# Patient Record
Sex: Female | Born: 2000 | Race: White | Hispanic: No | Marital: Single | Smoking: Never smoker
Health system: Southern US, Community
[De-identification: ages and names within clinical notes are randomized; demographics above are authoritative.]

## PROBLEM LIST (undated history)

## (undated) DIAGNOSIS — F909 Attention-deficit hyperactivity disorder, unspecified type: Secondary | ICD-10-CM

## (undated) HISTORY — PX: EYE SURGERY: SHX253

## (undated) HISTORY — PX: EXTERNAL EAR SURGERY: SHX627

## (undated) HISTORY — PX: TONSILLECTOMY: SUR1361

---

## 2018-04-15 ENCOUNTER — Ambulatory Visit (INDEPENDENT_AMBULATORY_CARE_PROVIDER_SITE_OTHER): Payer: BLUE CROSS/BLUE SHIELD | Admitting: Family Medicine

## 2018-04-15 ENCOUNTER — Encounter: Payer: Self-pay | Admitting: Family Medicine

## 2018-04-15 VITALS — BP 106/72 | HR 75 | Temp 97.8°F | Ht 65.0 in | Wt 139.0 lb

## 2018-04-15 DIAGNOSIS — F902 Attention-deficit hyperactivity disorder, combined type: Secondary | ICD-10-CM | POA: Diagnosis not present

## 2018-04-15 DIAGNOSIS — H9325 Central auditory processing disorder: Secondary | ICD-10-CM | POA: Diagnosis not present

## 2018-04-15 MED ORDER — AMPHETAMINE-DEXTROAMPHETAMINE 10 MG PO TABS: 10.0000 mg | ORAL_TABLET | Freq: Every day | ORAL | 0 refills | Status: DC

## 2018-04-15 MED ORDER — AMPHETAMINE-DEXTROAMPHETAMINE 20 MG PO TABS: 20.0000 mg | ORAL_TABLET | Freq: Every day | ORAL | 0 refills | Status: DC

## 2018-04-15 NOTE — Progress Notes (Addendum)
   Subjective:    Patient ID: Briana Carey, female    DOB: 03/05/2000, 18 y.o.   MRN: [id number]  HPI She is here for consultation concerning an underlying history of ADHD.  She has been on medication since high school and presently is taking Adderall 20 mg as well as a 10 in the afternoon.  She was evaluated at [facility] psychology department.  Her that information is in the record.  It indicates that she has had difficulty since grade school but was never diagnosed until high school.  She was placed on Adderall and has done quite nicely on that.  She is on the above-mentioned meication regimen.  The 20 mg pill lasts roughly 6 hours and when it wears off she becomes more distracted.   The psychological evaluation also showed an auditory processing disorder.  Review of Systems      Objective:   Physical Exam Alert and in no distress otherwise not examined.  Blood pressure is 106/72       Assessment & Plan:  ADHD (attention deficit hyperactivity disorder), combined type - Plan: amphetamine-dextroamphetamine (ADDERALL) 20 MG tablet, amphetamine-dextroamphetamine (ADDERALL) 10 MG tablet, amphetamine-dextroamphetamine (ADDERALL) 20 MG tablet, amphetamine-dextroamphetamine (ADDERALL) 20 MG tablet, amphetamine-dextroamphetamine (ADDERALL) 10 MG tablet, amphetamine-dextroamphetamine (ADDERALL) 10 MG tablet  Central auditory processing disorder She will continue on her present medication regimen of 20 mg in the morning and 10 mg in the afternoon and recheck here in 6 months

## 2018-04-15 NOTE — Addendum Note (Signed)
Addended by: Ronnald Nian on: 04/15/2018 12:21 PM   Modules accepted: Orders

## 2018-04-27 ENCOUNTER — Telehealth: Payer: Self-pay | Admitting: Family Medicine

## 2018-04-27 DIAGNOSIS — F902 Attention-deficit hyperactivity disorder, combined type: Secondary | ICD-10-CM

## 2018-04-27 MED ORDER — AMPHETAMINE-DEXTROAMPHETAMINE 10 MG PO TABS: 10.0000 mg | ORAL_TABLET | Freq: Every day | ORAL | 0 refills | Status: DC

## 2018-04-27 MED ORDER — AMPHETAMINE-DEXTROAMPHETAMINE 20 MG PO TABS: 20.0000 mg | ORAL_TABLET | Freq: Every day | ORAL | 0 refills | Status: DC

## 2018-04-27 NOTE — Telephone Encounter (Signed)
Medication was changed to a different drugstore due to insurance

## 2018-04-27 NOTE — Telephone Encounter (Signed)
Pt called and needs [facility] of [facility] because her insurance wont pay for it. [facility] on [address]

## 2018-04-27 NOTE — Telephone Encounter (Signed)
Done

## 2018-04-28 NOTE — Telephone Encounter (Signed)
Called pt no answer lvm. KH

## 2018-09-27 ENCOUNTER — Encounter (HOSPITAL_COMMUNITY): Payer: Self-pay | Admitting: Emergency Medicine

## 2018-09-27 ENCOUNTER — Emergency Department (HOSPITAL_COMMUNITY)
Admission: EM | Admit: 2018-09-27 | Discharge: 2018-09-27 | Disposition: A | Discharge status: Home / Self Care | Payer: BLUE CROSS/BLUE SHIELD | Attending: Emergency Medicine | Admitting: Emergency Medicine

## 2018-09-27 ENCOUNTER — Other Ambulatory Visit: Payer: Self-pay

## 2018-09-27 ENCOUNTER — Emergency Department (HOSPITAL_COMMUNITY): Payer: BLUE CROSS/BLUE SHIELD

## 2018-09-27 DIAGNOSIS — Z79899 Other long term (current) drug therapy: Secondary | ICD-10-CM | POA: Insufficient documentation

## 2018-09-27 DIAGNOSIS — S61411A Laceration without foreign body of right hand, initial encounter: Secondary | ICD-10-CM

## 2018-09-27 DIAGNOSIS — W25XXXA Contact with sharp glass, initial encounter: Secondary | ICD-10-CM | POA: Insufficient documentation

## 2018-09-27 DIAGNOSIS — Y93G1 Activity, food preparation and clean up: Secondary | ICD-10-CM | POA: Insufficient documentation

## 2018-09-27 DIAGNOSIS — S61210A Laceration without foreign body of right index finger without damage to nail, initial encounter: Secondary | ICD-10-CM | POA: Diagnosis not present

## 2018-09-27 DIAGNOSIS — Y92000 Kitchen of unspecified non-institutional (private) residence as  the place of occurrence of the external cause: Secondary | ICD-10-CM | POA: Insufficient documentation

## 2018-09-27 DIAGNOSIS — Y999 Unspecified external cause status: Secondary | ICD-10-CM | POA: Diagnosis not present

## 2018-09-27 HISTORY — DX: Attention-deficit hyperactivity disorder, unspecified type: F90.9

## 2018-09-27 MED ORDER — CEPHALEXIN 500 MG PO CAPS: 500.0000 mg | ORAL_CAPSULE | Freq: Four times a day (QID) | ORAL | 0 refills | Status: DC

## 2018-09-27 MED ORDER — BUPIVACAINE HCL (PF) 0.5 % IJ SOLN
10.0000 mL | Freq: Once | INTRAMUSCULAR | Status: AC
  Administered 2018-09-27: 10 mL
  Filled 2018-09-27: qty 30

## 2018-09-27 NOTE — Discharge Instructions (Signed)
°  Wound Care - Laceration You may remove the bandage after 24 hours. Clean the wound and surrounding area gently with tap water and mild soap. Rinse well and blot dry. Do not scrub the wound, as this may cause the wound edges to come apart. You may shower, but avoid submerging the wound, such as with a bath or swimming. Clean the wound daily to prevent infection. Do not use cleaners such as hydrogen peroxide or alcohol.   Scar reduction: Application of a topical antibiotic ointment, such as Neosporin, after the wound has begun to close and heal well can decrease scab formation and reduce scarring. After the wound has healed and wound closures have been removed, application of ointments such as Aquaphor can also reduce scar formation.  The key to scar reduction is keeping the skin well hydrated and supple. Drinking plenty of water throughout the day (At least eight 8oz glasses of water a day) is essential to staying well hydrated.  Sun exposure: Keep the wound out of the sun. After the wound has healed, continue to protect it from the sun by wearing protective clothing or applying sunscreen.  Pain: You may use Tylenol, naproxen, or ibuprofen for pain. Antiinflammatory medications: Take 600 mg of ibuprofen every 6 hours or 440 mg (over the counter dose) to 500 mg (prescription dose) of naproxen every 12 hours for the next 3 days. After this time, these medications may be used as needed for pain. Take these medications with food to avoid upset stomach. Choose only one of these medications, do not take them together. Acetaminophen (generic for Tylenol): Should you continue to have additional pain while taking the ibuprofen or naproxen, you may add in acetaminophen as needed. Your daily total maximum amount of acetaminophen from all sources should be limited to 4000mg /day for persons without liver problems, or 2000mg /day for those with liver problems.  Suture/staple removal: Suture removal to be managed by  the hand specialist.  Return to the ED sooner should the wound edges come apart or signs of infection arise, such as spreading redness, puffiness/swelling, pus draining from the wound, severe increase in pain, fever over 100.19F, or any other major issues.  For prescription assistance, may try using prescription discount sites or apps, such as goodrx.com

## 2018-09-27 NOTE — ED Notes (Signed)
Non stick wound dressing applied and finger splinted per PA request.

## 2018-09-27 NOTE — ED Provider Notes (Signed)
[facility]-EMERGENCY DEPT Provider Note   CSN: [id number] Arrival date & time: 09/27/18  1136    History   Chief Complaint Chief Complaint  Patient presents with  . Laceration    HPI Briana Carey is a 18 y.o. female.     HPI   Briana Carey is a 18 y.o. female, patient with no pertinent past medical history, presenting to the ED with laceration to the right hand that occurred shortly prior to arrival.  States she was washing dishes and a broken dish cut her hand. Pain is throbbing, moderate to severe, nonradiating. Tetanus updated 2012.  Patient currently attends [employer]. Denies numbness, weakness, other injuries.    Past Medical History:  Diagnosis Date  . ADHD     Patient Active Problem List   Diagnosis Date Noted  . ADHD (attention deficit hyperactivity disorder), combined type 04/15/2018  . Central auditory processing disorder 04/15/2018    History reviewed. No pertinent surgical history.   OB History   No obstetric history on file.      Home Medications    Prior to Admission medications   Medication Sig Start Date End Date Taking? Authorizing Provider  amphetamine-dextroamphetamine (ADDERALL) 10 MG tablet Take 1 tablet (10 mg total) by mouth daily after lunch. One in evening. 06/27/18   Ronnald NianLalonde, John C, MD  amphetamine-dextroamphetamine (ADDERALL) 10 MG tablet Take 1 tablet (10 mg total) by mouth daily after lunch. 05/28/18   Ronnald NianLalonde, John C, MD  amphetamine-dextroamphetamine (ADDERALL) 10 MG tablet Take 1 tablet (10 mg total) by mouth daily after lunch. 04/27/18   Ronnald NianLalonde, John C, MD  amphetamine-dextroamphetamine (ADDERALL) 20 MG tablet Take 1 tablet (20 mg total) by mouth daily. 06/27/18   Ronnald NianLalonde, John C, MD  amphetamine-dextroamphetamine (ADDERALL) 20 MG tablet Take 1 tablet (20 mg total) by mouth daily. 05/28/18   Ronnald NianLalonde, John C, MD  amphetamine-dextroamphetamine (ADDERALL) 20 MG tablet Take 1 tablet (20 mg total) by mouth daily. 04/27/18    Ronnald NianLalonde, John C, MD  cephALEXin (KEFLEX) 500 MG capsule Take 1 capsule (500 mg total) by mouth 4 (four) times daily. 09/27/18   Joy, Shawn C, PA-C  etonogestrel (NEXPLANON) 68 MG IMPL implant 1 each by Subdermal route once.    [provider]    Family History No family history on file.  Social History Social History   Tobacco Use  . Smoking status: Never Smoker  . Smokeless tobacco: Never Used  Substance Use Topics  . Alcohol use: Not Currently  . Drug use: Not Currently     Allergies   Patient has no known allergies.   Review of Systems Review of Systems  Musculoskeletal: Positive for arthralgias.  Skin: Positive for wound.  Neurological: Negative for weakness and numbness.     Physical Exam Updated Vital Signs BP (!) 169/95   Pulse 92   Temp 98.7 F (37.1 C) (Oral)   Resp (!) 22   SpO2 100%   Physical Exam Vitals signs and nursing note reviewed.  Constitutional:      General: She is not in acute distress.    Appearance: She is well-developed. She is not diaphoretic.  HENT:     Head: Normocephalic and atraumatic.  Eyes:     Conjunctiva/sclera: Conjunctivae normal.  Neck:     Musculoskeletal: Neck supple.  Cardiovascular:     Rate and Rhythm: Normal rate and regular rhythm.     Pulses:          Radial pulses are  2+ on the right side.  Pulmonary:     Effort: Pulmonary effort is normal.  Musculoskeletal:     Comments: Approximately 5 cm crescent-shaped laceration to the dorsal right hand over the MCP joint of the index finger. Patient has difficulty obtaining full extension at the MCP joint and has slow flexion at the MCP joint. Range of motion in the DIP and PIP joints are unaffected.  After the wound was anesthetized and washed out, it was noted there was violation of what appears to be the fascia covering the MCP joint. During suture repair, I was also able to confirm laceration through the extensor tendon.   Skin:    General: Skin is warm  and dry.     Capillary Refill: Capillary refill takes less than 2 seconds.     Coloration: Skin is not pale.  Neurological:     Mental Status: She is alert.     Comments: Sensation to light touch grossly intact throughout the right index finger, though somewhat decreased on the ulnar side. Patient is able to flex the right index finger at the MCP against resistance.  She is unable to extend at the MCP against resistance.  Psychiatric:        Behavior: Behavior normal.                       ED Treatments / Results  Labs (all labs ordered are listed, but only abnormal results are displayed) Labs Reviewed - No data to display  EKG None  Radiology Dg Hand Complete Right  Result Date: 09/27/2018 CLINICAL DATA:  Laceration to the second finger. EXAM: RIGHT HAND - COMPLETE 3+ VIEW COMPARISON:  None. FINDINGS: There is no evidence of fracture or dislocation. There is no evidence of arthropathy or other focal bone abnormality. Soft tissues laceration at the level of the second metacarpophalangeal joint. IMPRESSION: No acute osseous abnormality identified. Electronically Signed   By: Ted Mcalpineobrinka  Dimitrova M.D.   On: 09/27/2018 13:42    Procedures .Marland Kitchen.Laceration Repair  Date/Time: 09/27/2018 1:45 PM Performed by: Anselm PancoastJoy, Shawn C, PA-C Authorized by: Anselm PancoastJoy, Shawn C, PA-C   Consent:    Consent obtained:  Verbal   Consent given by:  Patient   Risks discussed:  Retained foreign body, pain, poor cosmetic result, poor wound healing, tendon damage, need for additional repair and infection Anesthesia (see MAR for exact dosages):    Anesthesia method:  Local infiltration   Local anesthetic:  Bupivacaine 0.5% w/o epi Laceration details:    Location:  Hand   Hand location:  R hand, dorsum   Length (cm):  5 Repair type:    Repair type:  Intermediate Pre-procedure details:    Preparation:  Patient was prepped and draped in usual sterile fashion and imaging obtained to evaluate for  foreign bodies Exploration:    Wound exploration: wound explored through full range of motion and entire depth of wound probed and visualized   Treatment:    Area cleansed with:  Betadine and saline   Amount of cleaning:  Extensive   Irrigation solution:  Sterile saline   Irrigation volume:  1000 cc   Irrigation method:  Syringe Fascia repair:    Suture size:  5-0   Suture material:  Vicryl (Rapide)   Suture technique:  Horizontal mattress   Number of sutures:  3 Skin repair:    Repair method:  Sutures   Suture size:  4-0   Wound skin closure material used: Vicryl.  Suture technique:  Horizontal mattress   Number of sutures:  8 Approximation:    Approximation:  Close Post-procedure details:    Dressing:  Non-adherent dressing, splint for protection and sterile dressing   Patient tolerance of procedure:  Tolerated well, no immediate complications   (including critical care time)  Medications Ordered in ED Medications  bupivacaine (MARCAINE) 0.5 % injection 10 mL (10 mLs Infiltration Given by Other 09/27/18 1437)     Initial Impression / Assessment and Plan / ED Course  I have reviewed the triage vital signs and the nursing notes.  Pertinent labs & imaging results that were available during my care of the patient were reviewed by me and considered in my medical decision making (see chart for details).  Clinical Course as of Sep 26 1549  Sun Sep 27, 2018  1231 Spoke with Dr. Doreatha Martin, on call for hand surgery. States he will review the images and call back.   [SJ]  7 Dr. Doreatha Martin called back. May be fascial or tendon damage based on pictures and physical exam findings.  States this will not be taken to the OR tonight. Repair the wound, including fascia repair.  Have the patient call the office tomorrow for follow up.   [SJ]  1430 Once I confirmed the complete tendon laceration during the course of the suture repair, I recontacted Dr. Doreatha Martin and updated him on this. He  states the patient can still follow-up with his office tomorrow morning.  Her case will likely be forwarded on to Dr. Burney Gauze for repair.    [SJ]    Clinical Course User Index [SJ] Joy, Shawn C, PA-C       Patient presents with laceration to the right dorsal hand over the MCP of the index finger.  She had suspicion for extensor tendon laceration on physical exam, which was confirmed during laceration repair. Patient was splinted in extension for comfort.  She will follow-up with hand surgery for definitive management. The patient was given instructions for home care as well as return precautions. Patient voices understanding of these instructions, accepts the plan, and is comfortable with discharge.   Findings and plan of care discussed with Gara Kroner, MD.   Final Clinical Impressions(s) / ED Diagnoses   Final diagnoses:  Laceration of right hand, foreign body presence unspecified, initial encounter    ED Discharge Orders         Ordered    cephALEXin (KEFLEX) 500 MG capsule  4 times daily     09/27/18 [place], Shawn C, PA-C 09/27/18 1555    LongWonda Olds, MD 09/27/18 1921

## 2018-09-27 NOTE — ED Notes (Signed)
XR at bedside

## 2018-09-27 NOTE — ED Triage Notes (Signed)
Patient reports washing dishes and breaking glass dish resulting in laceration to right index finger. Approximately two inch laceration. Bleeding in triage. Movement and sensation to finger.

## 2018-09-28 ENCOUNTER — Telehealth (HOSPITAL_COMMUNITY): Payer: Self-pay | Admitting: Emergency Medicine

## 2018-09-28 NOTE — Telephone Encounter (Signed)
09/28/18 4:40 PM Called to follow up with patient and check on her symptoms.  States pain is well controlled. She has been taking the antibiotic, as prescribed. No additional symptom onset. She has an appointment with hand surgeon, Dr. Burney Gauze, tomorrow morning.

## 2018-10-14 ENCOUNTER — Ambulatory Visit (INDEPENDENT_AMBULATORY_CARE_PROVIDER_SITE_OTHER): Payer: BC Managed Care – PPO | Admitting: Family Medicine

## 2018-10-14 VITALS — Temp 97.4°F | Wt 140.0 lb

## 2018-10-14 DIAGNOSIS — F902 Attention-deficit hyperactivity disorder, combined type: Secondary | ICD-10-CM

## 2018-10-14 MED ORDER — AMPHETAMINE-DEXTROAMPHETAMINE 20 MG PO TABS: 20.0000 mg | ORAL_TABLET | Freq: Every day | ORAL | 0 refills | Status: DC

## 2018-10-14 MED ORDER — AMPHETAMINE-DEXTROAMPHETAMINE 10 MG PO TABS: 10.0000 mg | ORAL_TABLET | Freq: Every day | ORAL | 0 refills | Status: DC

## 2018-10-14 NOTE — Progress Notes (Signed)
   Subjective:    Patient ID: Briana Carey, female    DOB: Jun 16, 2000, 18 y.o.   MRN: [id number]  HPI Documentation for virtual telephone encounter. Documentation for virtual audio and video telecommunications through WebEx encounter: The patient was located at home. The provider was located in the office. The patient did consent to this visit and is aware of possible charges through their insurance for this visit. The other persons participating in this telemedicine service were none. Time spent on call was 5 minutes and in review of previous records >10 minutes total. This virtual service is not related to other E/M service within previous 7 days 2 days visit and for ADD consult.  She has been taking 20 mg Adderall in the morning and last 5 hours.  She usually eats before she takes it because she knows it will interfere with her eating.  She gets good results with this.  She will take the 10 mg at 130 or 2 in the afternoon depending upon her afternoon schedule.  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 She is also under quarantine for COVID which will end on September 3 so she will will pick up a new prescription until then.  Review of Systems     Objective:   Physical Exam Alert and in no distress otherwise not examined       Assessment & Plan:  ADHD (attention deficit hyperactivity disorder), combined type - Plan: amphetamine-dextroamphetamine (ADDERALL) 20 MG tablet, amphetamine-dextroamphetamine (ADDERALL) 10 MG tablet, amphetamine-dextroamphetamine (ADDERALL) 10 MG tablet, amphetamine-dextroamphetamine (ADDERALL) 10 MG tablet, amphetamine-dextroamphetamine (ADDERALL) 20 MG tablet, amphetamine-dextroamphetamine (ADDERALL) 20 MG tablet She is using the medication appropriately.  I will continue her on this.  We will touch bases in 6 months.

## 2018-12-22 ENCOUNTER — Telehealth: Payer: Self-pay | Admitting: Family Medicine

## 2018-12-22 DIAGNOSIS — F902 Attention-deficit hyperactivity disorder, combined type: Secondary | ICD-10-CM

## 2018-12-22 MED ORDER — AMPHETAMINE-DEXTROAMPHETAMINE 10 MG PO TABS: 10.0000 mg | ORAL_TABLET | Freq: Every day | ORAL | 0 refills | Status: DC

## 2018-12-22 NOTE — Telephone Encounter (Signed)
Tammy from [facility] called and needs clarification on an adderall prescription that was sent in for pt. The directions stated for pt to take 1 tablet daily after lunch and 1 in the evening but its only 30 tablets. Please call Tammy or the pharmacist on duty to fix this [phone]

## 2019-03-09 ENCOUNTER — Telehealth: Payer: Self-pay

## 2019-03-09 NOTE — Telephone Encounter (Signed)
COVID athlete information received via fax from Athletic Trainer Verlene Mayer [phone] to set cardiology appt. Patient scheduled with Dr. Flora Lipps on 1/22 @ 9:20am. Trainer made aware of appointment to communicate to patient.

## 2019-03-11 NOTE — Progress Notes (Signed)
Cardiology Office Note:   Date:  03/12/2019  NAME:  Briana Carey    MRN: [id number] DOB:  06/10/2000   PCP:  Denita Lung, MD  Cardiologist:  No primary care provider on file.   Referring MD: Denita Lung, MD   Chief Complaint  Patient presents with  . Abnormal ECG   History of Present Illness:   Briana Carey is a 19 y.o. female with a hx of ADHD who is being seen today for the evaluation of abnormal EKG at the request of Denita Lung, MD.  She is a Web designer at [employer].  She presents for coronavirus heart screening.  She reports she was diagnosed with coronavirus on 02/16/2019.  Symptoms included loss of smell, aches, headache, cough, congestion and shortness of breath.  She did have symptoms of chest congestion.  She had no chest pain reported.  Symptoms lasted about 10 days and resolved on February 26, 2019.  She reports since her diagnosis she has had some shortness of breath with activity.  She is not gone back to competitive play and has mainly been doing activity such as walking.  She has no significant medical history other than an ADHD and she takes Adderall.  She has no significant cardiac medical history.  Mother has a history of PVCs.  She is not non-smoker and does not consume alcohol.  Today, she denies chest pain, shortness of breath, palpitations, any other symptoms.  Past Medical History: Past Medical History:  Diagnosis Date  . ADHD     Past Surgical History: Past Surgical History:  Procedure Laterality Date  . EXTERNAL EAR SURGERY    . EYE SURGERY    . TONSILLECTOMY      Current Medications: Current Meds  Medication Sig  . amphetamine-dextroamphetamine (ADDERALL) 10 MG tablet Take 1 tablet (10 mg total) by mouth daily after lunch.  . amphetamine-dextroamphetamine (ADDERALL) 10 MG tablet Take 1 tablet (10 mg total) by mouth daily after lunch.  . amphetamine-dextroamphetamine (ADDERALL) 10 MG tablet Take 1 tablet (10 mg total) by  mouth daily after lunch.  . amphetamine-dextroamphetamine (ADDERALL) 20 MG tablet Take 1 tablet (20 mg total) by mouth daily.  Marland Kitchen amphetamine-dextroamphetamine (ADDERALL) 20 MG tablet Take 1 tablet (20 mg total) by mouth daily.  Marland Kitchen amphetamine-dextroamphetamine (ADDERALL) 20 MG tablet Take 1 tablet (20 mg total) by mouth daily.  . cephALEXin (KEFLEX) 500 MG capsule Take 1 capsule (500 mg total) by mouth 4 (four) times daily.  Marland Kitchen etonogestrel (NEXPLANON) 68 MG IMPL implant 1 each by Subdermal route once.  . minocycline (MINOCIN) 50 MG capsule Take 50 mg by mouth 2 (two) times daily.     Allergies:    Patient has no known allergies.   Social History: Social History   Socioeconomic History  . Marital status: Single    Spouse name: Not on file  . Number of children: Not on file  . Years of education: Not on file  . Highest education level: Not on file  Occupational History  . Not on file  Tobacco Use  . Smoking status: Never Smoker  . Smokeless tobacco: Never Used  Substance and Sexual Activity  . Alcohol use: Not Currently  . Drug use: Not Currently  . Sexual activity: Yes    Birth control/protection: Pill  Other Topics Concern  . Not on file  Social History Narrative  . Not on file   Social Determinants of Health   Financial Resource Strain:   .  Difficulty of Paying Living Expenses: Not on file  Food Insecurity:   . Worried About Programme researcher, broadcasting/film/video in the Last Year: Not on file  . Ran Out of Food in the Last Year: Not on file  Transportation Needs:   . Lack of Transportation (Medical): Not on file  . Lack of Transportation (Non-Medical): Not on file  Physical Activity:   . Days of Exercise per Week: Not on file  . Minutes of Exercise per Session: Not on file  Stress:   . Feeling of Stress : Not on file  Social Connections:   . Frequency of Communication with Friends and Family: Not on file  . Frequency of Social Gatherings with Friends and Family: Not on file  .  Attends Religious Services: Not on file  . Active Member of Clubs or Organizations: Not on file  . Attends Banker Meetings: Not on file  . Marital Status: Not on file     Family History: The patient's family history includes Arrhythmia in her mother.  ROS:   All other ROS reviewed and negative. Pertinent positives noted in the HPI.     EKGs/Labs/Other Studies Reviewed:   The following studies were personally reviewed by me today:  EKG:  EKG is ordered today.  The ekg ordered today demonstrates sinus bradycardia, heart rate 57, no acute ST-T changes, no evidence of prior infarction, prominent sinus arrhythmia, and was personally reviewed by me.   Recent Labs: No results found for requested labs within last 8760 hours.   Recent Lipid Panel No results found for: CHOL, TRIG, HDL, CHOLHDL, VLDL, LDLCALC, LDLDIRECT  Physical Exam:   VS:  BP 125/79   Pulse (!) 57   Ht 5\' 6"  (1.676 m)   Wt 141 lb 9.6 oz (64.2 kg)   SpO2 99%   BMI 22.85 kg/m    Wt Readings from Last 3 Encounters:  03/12/19 141 lb 9.6 oz (64.2 kg)  10/14/18 140 lb (63.5 kg) (69 %, Z= 0.51)*  04/15/18 139 lb (63 kg) (70 %, Z= 0.52)*   * Growth percentiles are based on CDC (Girls, 2-20 Years) data.    General: Well nourished, well developed, in no acute distress Heart: Atraumatic, normal size  Eyes: PEERLA, EOMI  Neck: Supple, no JVD Endocrine: No thryomegaly Cardiac: Normal S1, S2; RRR; no murmurs, rubs, or gallops Lungs: Clear to auscultation bilaterally, no wheezing, rhonchi or rales  Abd: Soft, nontender, no hepatomegaly  Ext: No edema, pulses 2+ Musculoskeletal: No deformities, BUE and BLE strength normal and equal Skin: Warm and dry, no rashes   Neuro: Alert and oriented to person, place, time, and situation, CNII-XII grossly intact, no focal deficits  Psych: Normal mood and affect   ASSESSMENT:   Briana Carey is a 19 y.o. female who presents for the following: 1. Screening for heart  disease   2. Nonspecific abnormal electrocardiogram (ECG) (EKG)     PLAN:   1. Screening for heart disease 2. Nonspecific abnormal electrocardiogram (ECG) (EKG) -She does meet criteria for Covid heart screening.  She did have chest congestion and chest symptoms.  No chest pain reported.  Her EKG is normal for an athlete, sinus bradycardia with sinus arrhythmia.  There is no evidence of conduction disease and I have a low suspicion for myocarditis.  We will complete her screening protocol with a high-sensitivity troponin as well as echocardiogram with 3D full volume and strain imaging.  She she was instructed for no vigorous activity 24  hours before high-sensitivity troponin.  She cannot return to competitive play until we clear her with the above protocol.  She was in agreement with this plan.  We will see her as needed.   Disposition: Return if symptoms worsen or fail to improve.  Medication Adjustments/Labs and Tests Ordered: Current medicines are reviewed at length with the patient today.  Concerns regarding medicines are outlined above.  Orders Placed This Encounter  Procedures  . EKG 12-Lead  . ECHOCARDIOGRAM COMPLETE   No orders of the defined types were placed in this encounter.   Patient Instructions  Medication Instructions:  The current medical regimen is effective;  continue present plan and medications.  *If you need a refill on your cardiac medications before your next appointment, please call your pharmacy*  Lab Work: Troponin High Sensitivity- this can be done at [address] - same day as echo - Monday, Wednesday, Thursday, Friday only If you have labs (blood work) drawn today and your tests are completely normal, you will receive your results only by: Marland Kitchen MyChart Message (if you have MyChart) OR . A paper copy in the mail If you have any lab test that is abnormal or we need to change your treatment, we will call you to review the  results.  Testing/Procedures: Echocardiogram - Your physician has requested that you have an echocardiogram. Echocardiography is a painless test that uses sound waves to create images of your heart. It provides your doctor with information about the size and shape of your heart and how well your heart's chambers and valves are working. This procedure takes approximately one hour. There are no restrictions for this procedure. This will be performed at our [facility] location - [address], [address].   Follow-Up: At [facility], you and your health needs are our priority.  As part of our continuing mission to provide you with exceptional heart care, we have created designated Provider Care Teams.  These Care Teams include your primary Cardiologist (physician) and Advanced Practice Providers (APPs -  Physician Assistants and Nurse Practitioners) who all work together to provide you with the care you need, when you need it.  Your next appointment:   PRN  The format for your next appointment:   Either In Person or Virtual  Provider:   Lennie Odor, MD       Signed, Lenna Gilford. Flora Lipps, MD [facility]  [address] [place], [place] [address] [phone]  03/12/2019 10:25 AM

## 2019-03-12 ENCOUNTER — Encounter: Payer: Self-pay | Admitting: Cardiovascular Disease

## 2019-03-12 ENCOUNTER — Encounter (INDEPENDENT_AMBULATORY_CARE_PROVIDER_SITE_OTHER): Payer: Self-pay

## 2019-03-12 ENCOUNTER — Other Ambulatory Visit: Payer: Self-pay

## 2019-03-12 ENCOUNTER — Ambulatory Visit (INDEPENDENT_AMBULATORY_CARE_PROVIDER_SITE_OTHER): Payer: BC Managed Care – PPO | Admitting: Cardiovascular Disease

## 2019-03-12 VITALS — BP 125/79 | HR 57 | Ht 66.0 in | Wt 141.6 lb

## 2019-03-12 DIAGNOSIS — R9431 Abnormal electrocardiogram [ECG] [EKG]: Secondary | ICD-10-CM

## 2019-03-12 DIAGNOSIS — Z136 Encounter for screening for cardiovascular disorders: Secondary | ICD-10-CM | POA: Diagnosis not present

## 2019-03-12 NOTE — Telephone Encounter (Signed)
Patient was seen, tests ordered.  Thanks!

## 2019-03-12 NOTE — Patient Instructions (Signed)
Medication Instructions:  The current medical regimen is effective;  continue present plan and medications.  *If you need a refill on your cardiac medications before your next appointment, please call your pharmacy*  Lab Work: Troponin High Sensitivity- this can be done at [address] - same day as echo - Monday, Wednesday, Thursday, Friday only If you have labs (blood work) drawn today and your tests are completely normal, you will receive your results only by: Marland Kitchen MyChart Message (if you have MyChart) OR . A paper copy in the mail If you have any lab test that is abnormal or we need to change your treatment, we will call you to review the results.  Testing/Procedures: Echocardiogram - Your physician has requested that you have an echocardiogram. Echocardiography is a painless test that uses sound waves to create images of your heart. It provides your doctor with information about the size and shape of your heart and how well your heart's chambers and valves are working. This procedure takes approximately one hour. There are no restrictions for this procedure. This will be performed at our [facility] location - [address].   Follow-Up: At [facility], you and your health needs are our priority.  As part of our continuing mission to provide you with exceptional heart care, we have created designated Provider Care Teams.  These Care Teams include your primary Cardiologist (physician) and Advanced Practice Providers (APPs -  Physician Assistants and Nurse Practitioners) who all work together to provide you with the care you need, when you need it.  Your next appointment:   PRN  The format for your next appointment:   Either In Person or Virtual  Provider:   Lennie Odor, MD

## 2019-03-15 ENCOUNTER — Other Ambulatory Visit: Payer: BC Managed Care – PPO | Admitting: *Deleted

## 2019-03-15 ENCOUNTER — Other Ambulatory Visit: Payer: Self-pay

## 2019-03-15 DIAGNOSIS — Z136 Encounter for screening for cardiovascular disorders: Secondary | ICD-10-CM

## 2019-03-15 LAB — TROPONIN I (HIGH SENSITIVITY): Troponin I (High Sensitivity): <2 ng/L (ref <18)

## 2019-03-16 ENCOUNTER — Ambulatory Visit (HOSPITAL_COMMUNITY): Payer: BC Managed Care – PPO | Attending: Family Medicine

## 2019-03-16 DIAGNOSIS — Z136 Encounter for screening for cardiovascular disorders: Secondary | ICD-10-CM | POA: Diagnosis not present

## 2019-03-19 NOTE — Progress Notes (Signed)
Got it! Faxing it now

## 2019-09-29 ENCOUNTER — Encounter: Payer: Self-pay | Admitting: Family Medicine

## 2019-09-29 ENCOUNTER — Telehealth (INDEPENDENT_AMBULATORY_CARE_PROVIDER_SITE_OTHER): Payer: BC Managed Care – PPO | Admitting: Family Medicine

## 2019-09-29 ENCOUNTER — Other Ambulatory Visit: Payer: Self-pay

## 2019-09-29 VITALS — Ht 67.0 in | Wt 143.0 lb

## 2019-09-29 DIAGNOSIS — F902 Attention-deficit hyperactivity disorder, combined type: Secondary | ICD-10-CM

## 2019-09-29 MED ORDER — AMPHETAMINE-DEXTROAMPHETAMINE 10 MG PO TABS: 10.0000 mg | ORAL_TABLET | Freq: Every day | ORAL | 0 refills | Status: DC

## 2019-09-29 MED ORDER — AMPHETAMINE-DEXTROAMPHETAMINE 20 MG PO TABS: 20.0000 mg | ORAL_TABLET | Freq: Every day | ORAL | 0 refills | Status: DC

## 2019-09-29 NOTE — Progress Notes (Signed)
   Subjective:    Patient ID: Briana Carey, female    DOB: 02-07-2001, 18 y.o.   MRN: [id number]  HPI I connected with  Briana Carey on 09/29/19 by a video enabled telemedicine application and verified that I am speaking with the correct person using two identifiers. I discussed the limitations of evaluation and management by telemedicine. The patient expressed understanding and agreed to proceed.  Consult was done over the phone as she did not have access to a computer She is in her apartment I am in my office.  She is in her apartment.  I am in my office. She has underlying ADD and was diagnosed while a junior in high school.  She was then retested when she came to college.  Her present dosing regimen is 20 mg in the morning and 10 mg dosing around 1 in the afternoon which gives her roughly 8 hours of benefit.  She usually eats before she takes her first pill because it can interfere with her eating.  She did very well with her she did very well with her academics this last semester.  Review of Systems     Objective:   Physical Exam Alert and in no distress otherwise not examined       Assessment & Plan:  ADHD (attention deficit hyperactivity disorder), combined type - Plan: amphetamine-dextroamphetamine (ADDERALL) 10 MG tablet, amphetamine-dextroamphetamine (ADDERALL) 10 MG tablet, amphetamine-dextroamphetamine (ADDERALL) 20 MG tablet, amphetamine-dextroamphetamine (ADDERALL) 10 MG tablet, amphetamine-dextroamphetamine (ADDERALL) 20 MG tablet, amphetamine-dextroamphetamine (ADDERALL) 20 MG tablet

## 2019-10-19 ENCOUNTER — Ambulatory Visit (INDEPENDENT_AMBULATORY_CARE_PROVIDER_SITE_OTHER): Payer: Self-pay | Admitting: Orthopedic Surgery

## 2019-10-19 DIAGNOSIS — G8929 Other chronic pain: Secondary | ICD-10-CM

## 2019-10-19 DIAGNOSIS — M545 Low back pain: Secondary | ICD-10-CM

## 2019-10-20 ENCOUNTER — Ambulatory Visit: Payer: BC Managed Care – PPO | Admitting: Orthopedic Surgery

## 2019-10-26 ENCOUNTER — Other Ambulatory Visit: Payer: Self-pay | Admitting: Orthopedic Surgery

## 2019-10-26 DIAGNOSIS — M545 Low back pain, unspecified: Secondary | ICD-10-CM

## 2019-10-27 ENCOUNTER — Other Ambulatory Visit: Payer: Self-pay

## 2019-10-30 ENCOUNTER — Encounter: Payer: Self-pay | Admitting: Orthopedic Surgery

## 2019-10-30 NOTE — Progress Notes (Signed)
   Post-Op Visit Note   Patient: Briana Carey           Date of Birth: 2000-09-12           MRN: [id number] Visit Date: 10/19/2019 PCP: Ronnald Nian, MD   Assessment & Plan:  Chief Complaint: No chief complaint on file.  Visit Diagnoses:  1. 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  Started about 2 years ago.  Was primarily focal pain localized in the back with no radicular symptoms.  Hurts for her to practice on the turf.  She is try Tylenol and naproxen with no help.  She had an MRI scan which showed a small curve but nothing definitively treatable.  That scan was done 2 years ago.  Back was hurting the summer but she was doing some physical labor during the summer.  Fatigue in the legs is the new symptom.  The pain does not wake her from sleep at night.  She states that the back pain affects her in a way that she is about 60% of the player she would be if she had no back pain.  On exam she has normal gait alignment.  No leg atrophy.  Good 5 out of 5 ankle dorsiflexion plantarflexion quad hamstring strength.  No masses lymphadenopathy or skin changes noted in that back region.  Some pain with forward lateral bending.  Symmetric reflexes.  Impression is central disc lumbar spine with worsening radicular symptoms.  Plain radiographs from 2 years ago unremarkable for any type of bony pathology.  Needs MRI scan to evaluate central disc along with Medrol Dosepak and Robaxin.  Follow-up after that study.  Follow-Up Instructions: Return if symptoms worsen or fail to improve, for after MRI.   Orders:  No orders of the defined types were placed in this encounter.  No orders of the defined types were placed in this encounter.   Imaging: No results found.  PMFS History: Patient Active Problem List   Diagnosis Date Noted  . ADHD (attention deficit hyperactivity disorder), combined type 04/15/2018  . Central auditory  processing disorder 04/15/2018   Past Medical History:  Diagnosis Date  . ADHD     Family History  Problem Relation Age of Onset  . Arrhythmia Mother     Past Surgical History:  Procedure Laterality Date  . EXTERNAL EAR SURGERY    . EYE SURGERY    . TONSILLECTOMY     Social History   Occupational History  . Not on file  Tobacco Use  . Smoking status: Never Smoker  . Smokeless tobacco: Never Used  Substance and Sexual Activity  . Alcohol use: Not Currently  . Drug use: Not Currently  . Sexual activity: Yes    Birth control/protection: Pill

## 2019-11-01 ENCOUNTER — Ambulatory Visit (INDEPENDENT_AMBULATORY_CARE_PROVIDER_SITE_OTHER): Payer: BC Managed Care – PPO | Admitting: Orthopedic Surgery

## 2019-11-01 DIAGNOSIS — M545 Low back pain, unspecified: Secondary | ICD-10-CM

## 2019-11-03 ENCOUNTER — Other Ambulatory Visit: Payer: Self-pay

## 2019-11-03 ENCOUNTER — Encounter: Payer: Self-pay | Admitting: Orthopedic Surgery

## 2019-11-03 NOTE — Progress Notes (Signed)
   Post-Op Visit Note   Patient: Briana Carey           Date of Birth: 06/26/2000           MRN: [id number] Visit Date: 11/01/2019 PCP: Ronnald Nian, MD   Assessment & Plan:  Chief Complaint: No chief complaint on file.  Visit Diagnoses:  1. Acute low back pain, unspecified back pain laterality, unspecified whether sciatica present     Plan: Medicine is a patient with low back pain.  Since she was last seen she is had an MRI scan which shows a very small L4-5 bulging disc.  Symptoms have continued primarily in the back without radiation.  On exam she has no nerve root tension signs and some mild pain with forward lateral bending.  The rest of her exam is unchanged.  Plan at this time is to set her up with Dr. Alvester Morin for lumbar spine ESI.  Follow-up with me as needed.  This does not look surgical and should respond well to injection.  Follow-Up Instructions: No follow-ups on file.   Orders:  No orders of the defined types were placed in this encounter.  No orders of the defined types were placed in this encounter.   Imaging: No results found.  PMFS History: Patient Active Problem List   Diagnosis Date Noted  . ADHD (attention deficit hyperactivity disorder), combined type 04/15/2018  . Central auditory processing disorder 04/15/2018   Past Medical History:  Diagnosis Date  . ADHD     Family History  Problem Relation Age of Onset  . Arrhythmia Mother     Past Surgical History:  Procedure Laterality Date  . EXTERNAL EAR SURGERY    . EYE SURGERY    . TONSILLECTOMY     Social History   Occupational History  . Not on file  Tobacco Use  . Smoking status: Never Smoker  . Smokeless tobacco: Never Used  Substance and Sexual Activity  . Alcohol use: Not Currently  . Drug use: Not Currently  . Sexual activity: Yes    Birth control/protection: Pill

## 2019-11-04 NOTE — Addendum Note (Signed)
Addended byPrescott Parma on: 11/04/2019 09:35 AM   Modules accepted: Orders

## 2019-11-08 ENCOUNTER — Telehealth: Payer: Self-pay

## 2019-11-08 NOTE — Telephone Encounter (Signed)
Patient called triage. She is scheduled for an injection with Dr.Newton on 11/24/2019. She is requesting an earlier appointment if at all possible.  Please call patient back at #[phone]. Thanks!

## 2019-11-09 NOTE — Telephone Encounter (Signed)
Tried to call patient to discuss a possible work-in spot for injection. No answer and vm not set up.

## 2019-11-11 ENCOUNTER — Other Ambulatory Visit: Payer: Self-pay

## 2019-11-11 ENCOUNTER — Encounter: Payer: Self-pay | Admitting: Physical Medicine and Rehabilitation

## 2019-11-11 ENCOUNTER — Ambulatory Visit (INDEPENDENT_AMBULATORY_CARE_PROVIDER_SITE_OTHER): Payer: BC Managed Care – PPO | Admitting: Physical Medicine and Rehabilitation

## 2019-11-11 ENCOUNTER — Ambulatory Visit: Payer: Self-pay

## 2019-11-11 VITALS — BP 116/75 | HR 52

## 2019-11-11 DIAGNOSIS — M5116 Intervertebral disc disorders with radiculopathy, lumbar region: Secondary | ICD-10-CM

## 2019-11-11 MED ORDER — METHYLPREDNISOLONE ACETATE 80 MG/ML IJ SUSP
80.0000 mg | Freq: Once | INTRAMUSCULAR | Status: AC
  Administered 2019-11-11: 80 mg

## 2019-11-11 NOTE — Progress Notes (Signed)
Pt states lower back pain. Pt state when she playing soccer and having to run back, forward and having to zig n zag. Pt state when she sitting at her locker and evening laying in bed she feels the pain. Pt state she use heating pas and sometime muscle relaxer to ease the pain.  Numeric Pain Rating Scale and Functional Assessment Average Pain 6   In the last MONTH (on 0-10 scale) has pain interfered with the following?  1. General activity like being  able to carry out your everyday physical activities such as walking, climbing stairs, carrying groceries, or moving a chair?  Rating(7)   +Driver, -BT, -Dye Allergies.

## 2019-11-11 NOTE — Progress Notes (Addendum)
Briana Carey - 19 y.o. female MRN [id number]  Date of birth: Jun 12, 2000  Office Visit Note: Visit Date: 11/11/2019 PCP: Ronnald Nian, MD Referred by: Ronnald Nian, MD  Subjective: Chief Complaint  Patient presents with  . Lower Back - Pain   HPI:  Briana Carey is a 19 y.o. female who comes in today at the request of Dr. Burnard Bunting for planned Bilateral L5-S1 Lumbar epidural steroid injection with fluoroscopic guidance.  The patient has failed conservative care including home exercise, medications, time and activity modification.  This injection will be diagnostic and hopefully therapeutic.  Please see requesting physician notes for further details and justification.  MRI reviewed with images and spine model.  MRI reviewed in the note below.   ROS Otherwise per HPI.  Assessment & Plan: Visit Diagnoses:  1. Radiculopathy due to lumbar intervertebral disc disorder     Plan: No additional findings.   Meds & Orders:  Meds ordered this encounter  Medications  . methylPREDNISolone acetate (DEPO-MEDROL) injection 80 mg    Orders Placed This Encounter  Procedures  . XR C-ARM NO REPORT  . Epidural Steroid injection    Follow-up: Return for visit to requesting physician as needed.   Procedures: No procedures performed  Lumbar Epidural Steroid Injection - Interlaminar Approach with Fluoroscopic Guidance  Patient: Briana Carey      Date of Birth: [birth year] MRN: [id number] PCP: Ronnald Nian, MD      Visit Date: 11/11/2019   Universal Protocol:     Consent Given By: the patient  Position: PRONE  Additional Comments: Vital signs were monitored before and after the procedure. Patient was prepped and draped in the usual sterile fashion. The correct patient, procedure, and site was verified.   Injection Procedure Details:  Procedure Site One Meds Administered:  Meds ordered this encounter  Medications  . methylPREDNISolone acetate (DEPO-MEDROL)  injection 80 mg     Laterality: Right  Location/Site:  L4-L5  Needle size: 20 G  Needle type: Tuohy  Needle Placement: Paramedian epidural  Findings:   -Comments: Excellent flow of contrast into the epidural space.  Procedure Details: Using a paramedian approach from the side mentioned above, the region overlying the inferior lamina was localized under fluoroscopic visualization and the soft tissues overlying this structure were infiltrated with 4 ml. of 1% Lidocaine without Epinephrine. The Tuohy needle was inserted into the epidural space using a paramedian approach.   The epidural space was localized using loss of resistance along with lateral and bi-planar fluoroscopic views.  After negative aspirate for air, blood, and CSF, a 2 ml. volume of Isovue-250 was injected into the epidural space and the flow of contrast was observed. Radiographs were obtained for documentation purposes.    The injectate was administered into the level noted above.   Additional Comments:  The patient tolerated the procedure well Dressing: 2 x 2 sterile gauze and Band-Aid    Post-procedure details: Patient was observed during the procedure. Post-procedure instructions were reviewed.  Patient left the clinic in stable condition.     Clinical History: MRI scan which shows a very small L4-5 bulging disc.     Objective:  VS:  HT:    WT:   BMI:     BP:116/75  HR:(!) 52bpm  TEMP: ( )  RESP:  Physical Exam Constitutional:      General: She is not in acute distress.    Appearance: Normal appearance. She is not ill-appearing.  HENT:  Head: Normocephalic and atraumatic.     Right Ear: External ear normal.     Left Ear: External ear normal.  Eyes:     Extraocular Movements: Extraocular movements intact.  Cardiovascular:     Rate and Rhythm: Normal rate.     Pulses: Normal pulses.  Musculoskeletal:     Right lower leg: No edema.     Left lower leg: No edema.     Comments: Patient  has good distal strength with no pain over the greater trochanters.  No clonus or focal weakness.  Skin:    Findings: No erythema, lesion or rash.  Neurological:     General: No focal deficit present.     Mental Status: She is alert and oriented to person, place, and time.     Sensory: No sensory deficit.     Motor: No weakness or abnormal muscle tone.     Coordination: Coordination normal.  Psychiatric:        Mood and Affect: Mood normal.        Behavior: Behavior normal.      Imaging: Epidural Steroid injection  Result Date: 11/11/2019 Tyrell Antonio, MD     11/11/2019 10:35 AM Lumbar Epidural Steroid Injection - Interlaminar Approach with Fluoroscopic Guidance Patient: Briana Carey     Date of Birth: 2000/12/24 MRN: [id number] PCP: Ronnald Nian, MD     Visit Date: 11/11/2019  Universal Protocol:   Consent Given By: the patient Position: PRONE Additional Comments: Vital signs were monitored before and after the procedure. Patient was prepped and draped in the usual sterile fashion. The correct patient, procedure, and site was verified. Injection Procedure Details: Procedure Site One Meds Administered: Meds ordered this encounter Medications . methylPREDNISolone acetate (DEPO-MEDROL) injection 80 mg  Laterality: Right Location/Site: L4-L5 Needle size: 20 G Needle type: Tuohy Needle Placement: Paramedian epidural Findings:  -Comments: Excellent flow of contrast into the epidural space. Procedure Details: Using a paramedian approach from the side mentioned above, the region overlying the inferior lamina was localized under fluoroscopic visualization and the soft tissues overlying this structure were infiltrated with 4 ml. of 1% Lidocaine without Epinephrine. The Tuohy needle was inserted into the epidural space using a paramedian approach. The epidural space was localized using loss of resistance along with lateral and bi-planar fluoroscopic views.  After negative aspirate for air, blood, and  CSF, a 2 ml. volume of Isovue-250 was injected into the epidural space and the flow of contrast was observed. Radiographs were obtained for documentation purposes.  The injectate was administered into the level noted above. Additional Comments: The patient tolerated the procedure well Dressing: 2 x 2 sterile gauze and Band-Aid  Post-procedure details: Patient was observed during the procedure. Post-procedure instructions were reviewed. Patient left the clinic in stable condition.

## 2019-11-11 NOTE — Procedures (Signed)
Lumbar Epidural Steroid Injection - Interlaminar Approach with Fluoroscopic Guidance  Patient: Briana Carey      Date of Birth: 2000-03-10 MRN: [id number] PCP: Ronnald Nian, MD      Visit Date: 11/11/2019   Universal Protocol:     Consent Given By: the patient  Position: PRONE  Additional Comments: Vital signs were monitored before and after the procedure. Patient was prepped and draped in the usual sterile fashion. The correct patient, procedure, and site was verified.   Injection Procedure Details:  Procedure Site One Meds Administered:  Meds ordered this encounter  Medications   methylPREDNISolone acetate (DEPO-MEDROL) injection 80 mg     Laterality: Right  Location/Site:  L4-L5  Needle size: 20 G  Needle type: Tuohy  Needle Placement: Paramedian epidural  Findings:   -Comments: Excellent flow of contrast into the epidural space.  Procedure Details: Using a paramedian approach from the side mentioned above, the region overlying the inferior lamina was localized under fluoroscopic visualization and the soft tissues overlying this structure were infiltrated with 4 ml. of 1% Lidocaine without Epinephrine. The Tuohy needle was inserted into the epidural space using a paramedian approach.   The epidural space was localized using loss of resistance along with lateral and bi-planar fluoroscopic views.  After negative aspirate for air, blood, and CSF, a 2 ml. volume of Isovue-250 was injected into the epidural space and the flow of contrast was observed. Radiographs were obtained for documentation purposes.    The injectate was administered into the level noted above.   Additional Comments:  The patient tolerated the procedure well Dressing: 2 x 2 sterile gauze and Band-Aid    Post-procedure details: Patient was observed during the procedure. Post-procedure instructions were reviewed.  Patient left the clinic in stable condition.

## 2019-11-24 ENCOUNTER — Ambulatory Visit: Payer: BC Managed Care – PPO | Admitting: Physical Medicine and Rehabilitation

## 2020-11-13 ENCOUNTER — Other Ambulatory Visit: Payer: Self-pay

## 2020-11-13 ENCOUNTER — Telehealth (INDEPENDENT_AMBULATORY_CARE_PROVIDER_SITE_OTHER): Payer: BC Managed Care – PPO | Admitting: Family Medicine

## 2020-11-13 DIAGNOSIS — F902 Attention-deficit hyperactivity disorder, combined type: Secondary | ICD-10-CM

## 2020-11-13 MED ORDER — AMPHETAMINE-DEXTROAMPHETAMINE 20 MG PO TABS: 20.0000 mg | ORAL_TABLET | Freq: Two times a day (BID) | ORAL | 0 refills | Status: AC

## 2020-11-13 MED ORDER — AMPHETAMINE-DEXTROAMPHETAMINE 20 MG PO TABS: 20.0000 mg | ORAL_TABLET | Freq: Every day | ORAL | 0 refills | Status: AC

## 2020-11-13 NOTE — Progress Notes (Signed)
   Subjective:    Patient ID: Briana Carey, female    DOB: 09/18/2000, 20 y.o.   MRN: [id number]  HPI Documentation for virtual audio and video telecommunications through Caregility encounter: The patient was located at home. 2 patient identifiers used.  The provider was located in the office. The patient did consent to this visit and is aware of possible charges through their insurance for this visit. The other persons participating in this telemedicine service were none. Time spent on call was 4 minutes and in review of previous records >16 minutes total for counseling and coordination of care. This virtual service is not related to other E/M service within previous 7 days.  Today's visit is to discuss her ADD medication.  She has been alternating between 10 and 20.  She states that the 20 makes her have a dry mouth however upon further discussion with her, the real issue is that she is not hydrating or giving herself nutrition while on the medication.  Last roughly 7 hours.  She states that it definitely does help her focus.  Review of Systems     Objective:   Physical Exam Alert and in no distress otherwise not examined       Assessment & Plan:  ADHD (attention deficit hyperactivity disorder), combined type - Plan: amphetamine-dextroamphetamine (ADDERALL) 20 MG tablet, amphetamine-dextroamphetamine (ADDERALL) 20 MG tablet, amphetamine-dextroamphetamine (ADDERALL) 20 MG tablet I will go ahead and give her the 20 mg.  She states she might on occasion break it in half and only used 10 recognizing we will give her full focus.  I then discussed the fact that she needs to keep her self well-hydrated and sometimes even eat things when she does not want to.  This is especially true if she is really busy during the summer months.  Also reinforced the need for her to use it on an as-needed basis which she has been doing anyway

## 2021-02-07 IMAGING — DX RIGHT HAND - COMPLETE 3+ VIEW
3 series · 3 of 3 positions shown · non-contrast
Comparison: None.

CLINICAL DATA: Laceration to the second finger.

EXAM:
RIGHT HAND - COMPLETE 3+ VIEW

[hand ap]
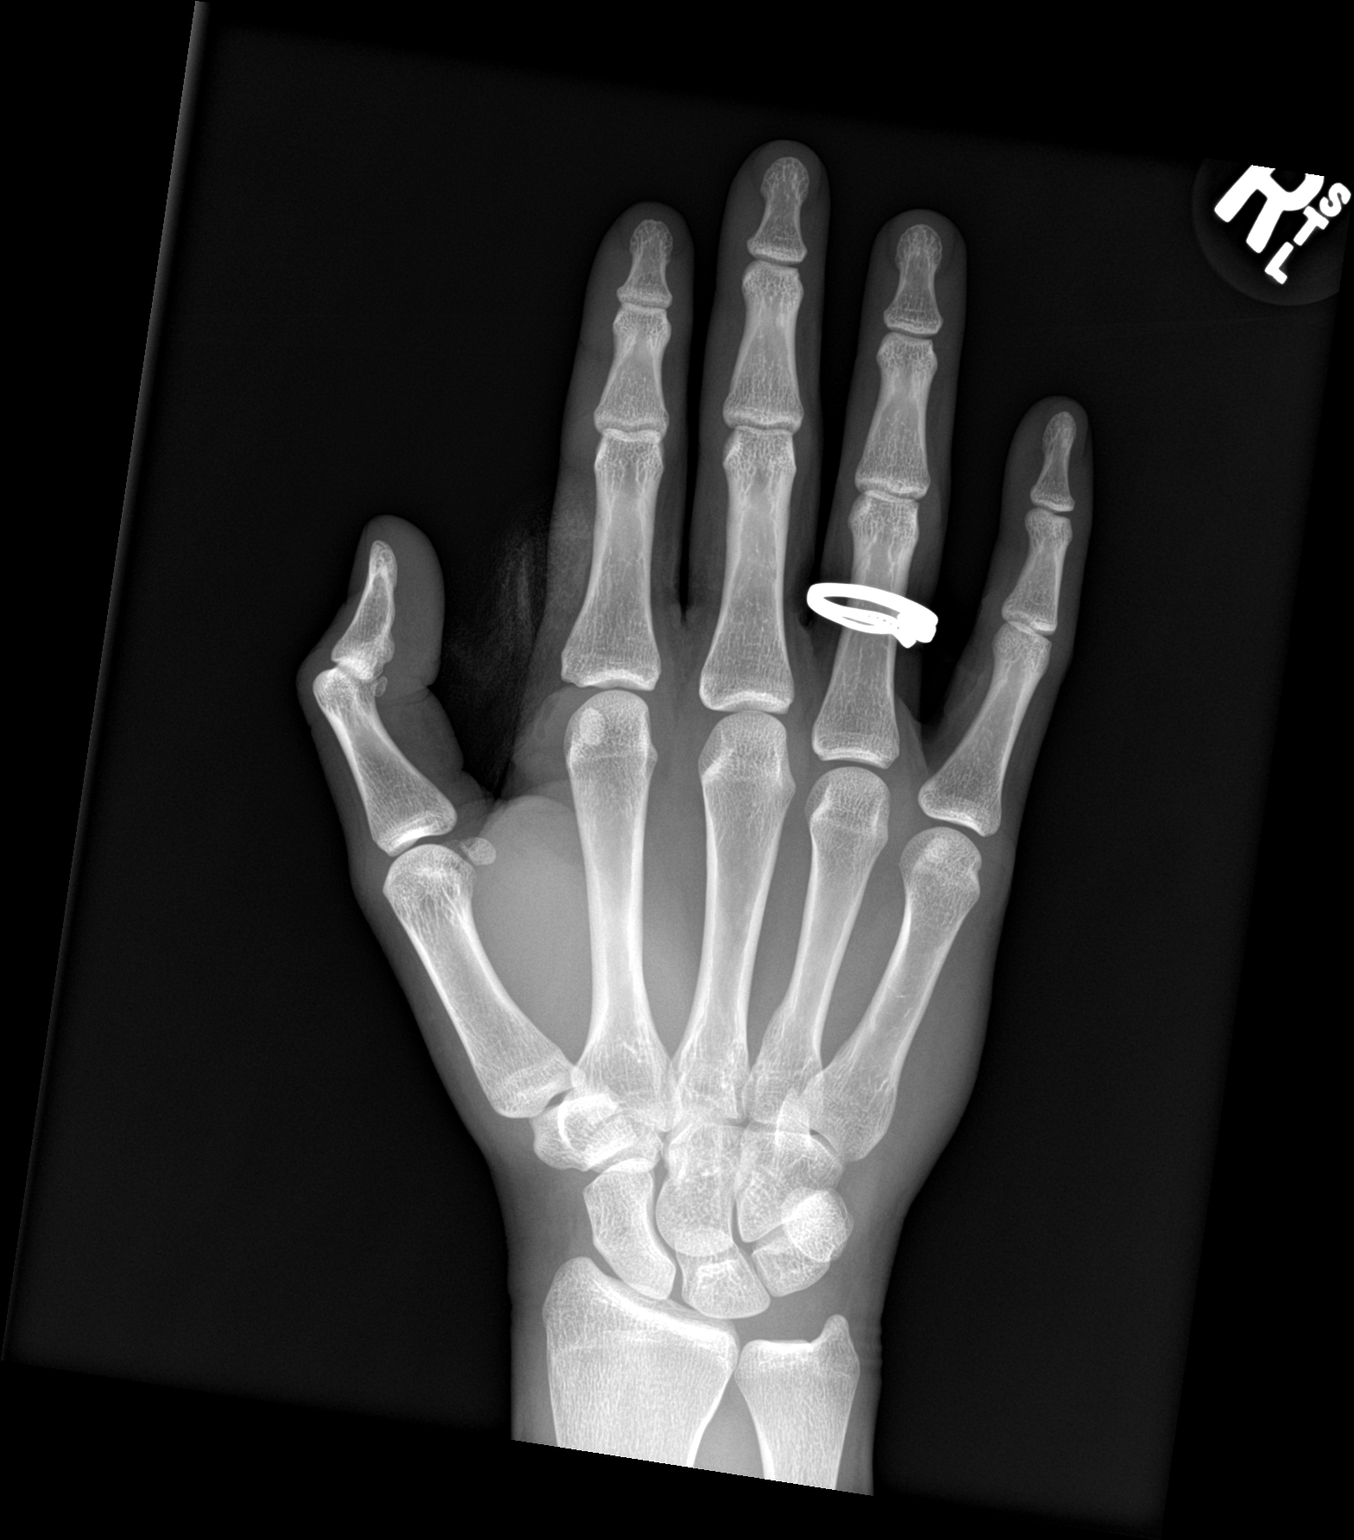

[hand obl]
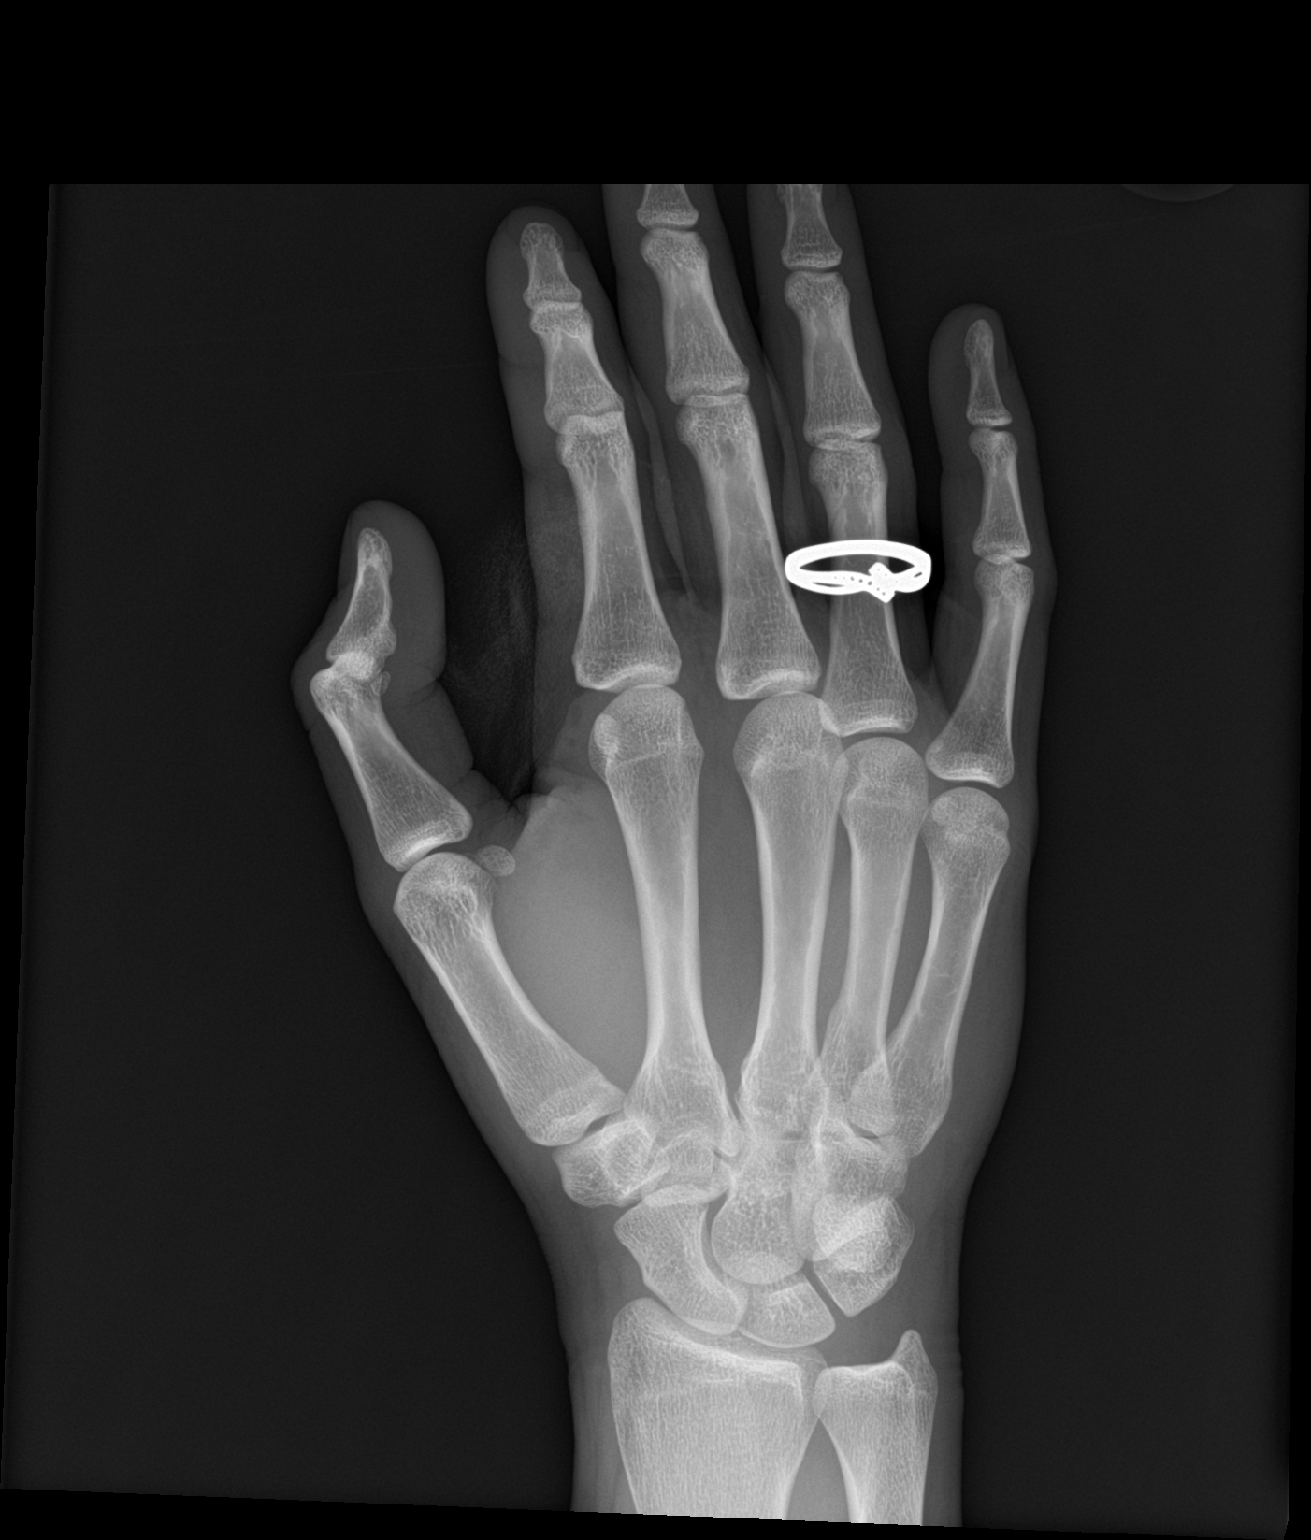

[hand lat]
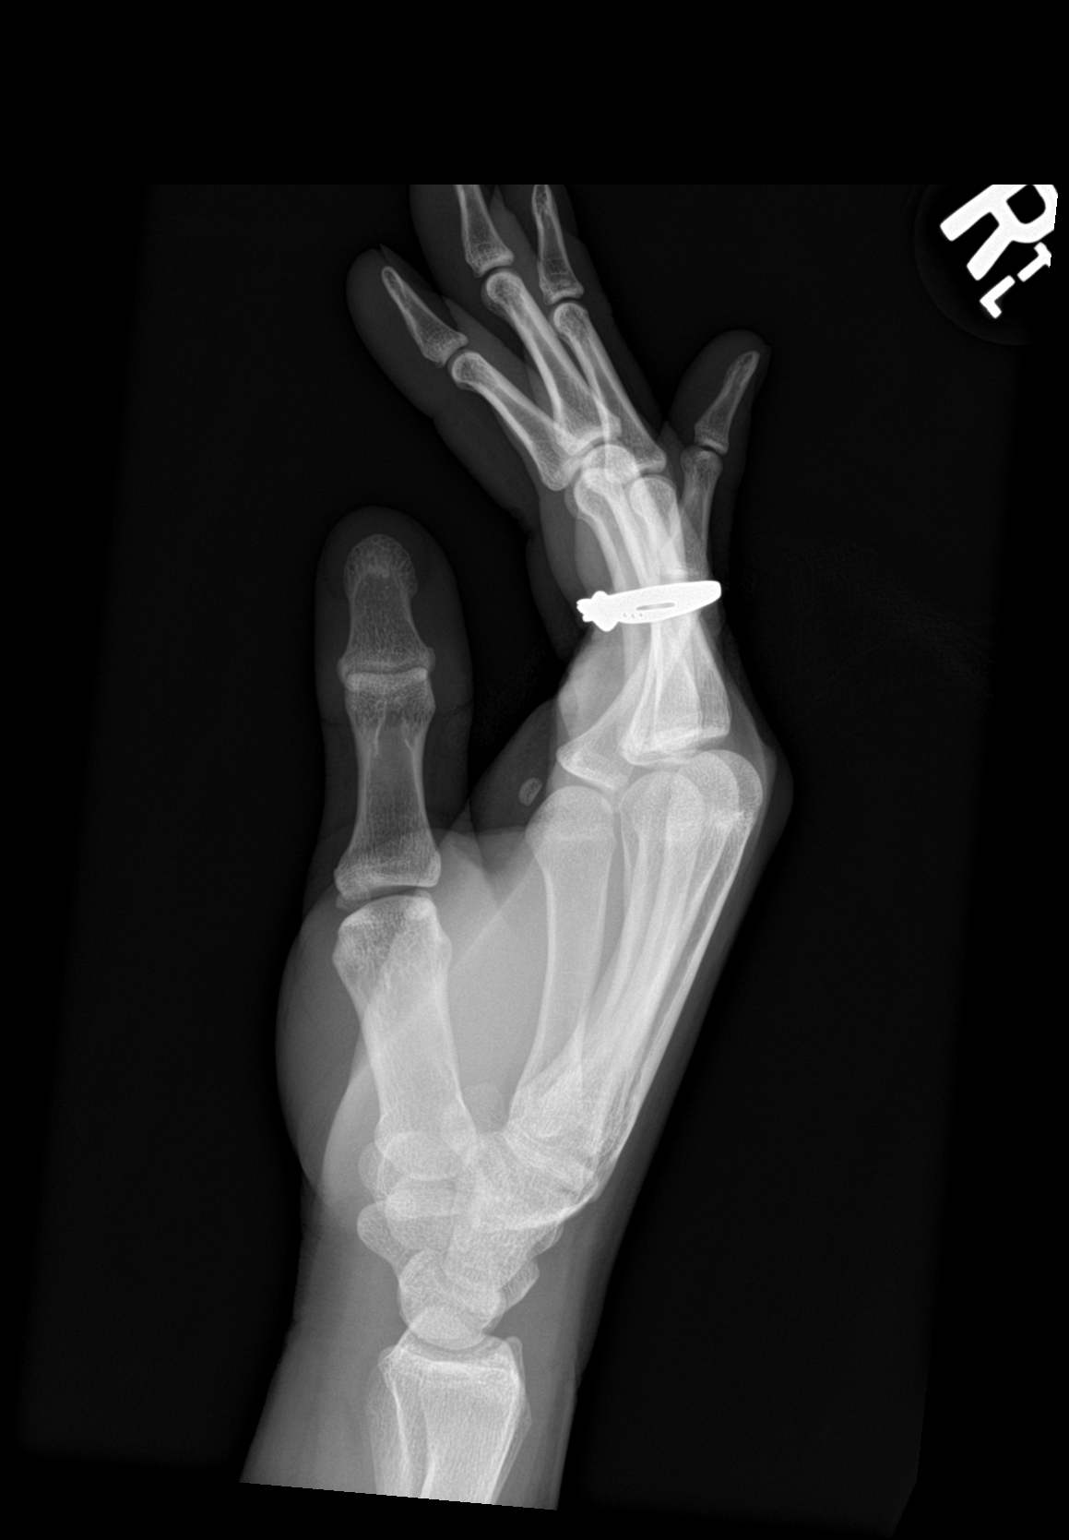

[3 of 3 positions shown; findings below may reference images not displayed]

FINDINGS: There is no evidence of fracture or dislocation. There is no
evidence of arthropathy or other focal bone abnormality. Soft
tissues laceration at the level of the second metacarpophalangeal
joint.
IMPRESSION: No acute osseous abnormality identified.

## 2021-04-19 ENCOUNTER — Telehealth: Payer: Self-pay | Admitting: Family Medicine

## 2021-04-19 NOTE — Telephone Encounter (Signed)
After 3 failed attempt at faxing, records mailed to [email or web address]  per medical records request.  ?

## 2022-08-26 ENCOUNTER — Ambulatory Visit
Admission: RE | Admit: 2022-08-26 | Discharge: 2022-08-26 | Disposition: A | Discharge status: Home / Self Care | Payer: BC Managed Care – PPO | Source: Ambulatory Visit | Attending: Internal Medicine | Admitting: Internal Medicine

## 2022-08-26 ENCOUNTER — Other Ambulatory Visit: Payer: Self-pay | Admitting: Internal Medicine

## 2022-08-26 DIAGNOSIS — M545 Low back pain, unspecified: Secondary | ICD-10-CM

## 2023-05-20 ENCOUNTER — Other Ambulatory Visit: Payer: Self-pay | Admitting: Family Medicine

## 2023-05-20 DIAGNOSIS — K429 Umbilical hernia without obstruction or gangrene: Secondary | ICD-10-CM

## 2023-05-21 ENCOUNTER — Ambulatory Visit
Admission: RE | Admit: 2023-05-21 | Discharge: 2023-05-21 | Disposition: A | Discharge status: Home / Self Care | Source: Ambulatory Visit | Attending: Family Medicine | Admitting: Family Medicine

## 2023-05-21 DIAGNOSIS — K429 Umbilical hernia without obstruction or gangrene: Secondary | ICD-10-CM
